# Patient Record
Sex: Male | Born: 1985 | Race: Black or African American | Hispanic: No | Marital: Married | Smoking: Never smoker
Health system: Southern US, Community
[De-identification: ages and names within clinical notes are randomized; demographics above are authoritative.]

## PROBLEM LIST (undated history)

## (undated) DIAGNOSIS — J45909 Unspecified asthma, uncomplicated: Secondary | ICD-10-CM

---

## 2013-02-12 ENCOUNTER — Encounter (HOSPITAL_COMMUNITY): Payer: Self-pay | Admitting: Emergency Medicine

## 2013-02-12 ENCOUNTER — Emergency Department (HOSPITAL_COMMUNITY)
Admission: EM | Admit: 2013-02-12 | Discharge: 2013-02-12 | Disposition: A | Discharge status: Home / Self Care | Payer: No Typology Code available for payment source | Attending: Emergency Medicine | Admitting: Emergency Medicine

## 2013-02-12 DIAGNOSIS — S39012A Strain of muscle, fascia and tendon of lower back, initial encounter: Secondary | ICD-10-CM

## 2013-02-12 DIAGNOSIS — S335XXA Sprain of ligaments of lumbar spine, initial encounter: Secondary | ICD-10-CM | POA: Insufficient documentation

## 2013-02-12 DIAGNOSIS — Y92410 Unspecified street and highway as the place of occurrence of the external cause: Secondary | ICD-10-CM | POA: Insufficient documentation

## 2013-02-12 DIAGNOSIS — Y9389 Activity, other specified: Secondary | ICD-10-CM | POA: Insufficient documentation

## 2013-02-12 DIAGNOSIS — Y9289 Other specified places as the place of occurrence of the external cause: Secondary | ICD-10-CM | POA: Insufficient documentation

## 2013-02-12 DIAGNOSIS — S139XXA Sprain of joints and ligaments of unspecified parts of neck, initial encounter: Secondary | ICD-10-CM | POA: Insufficient documentation

## 2013-02-12 DIAGNOSIS — S161XXA Strain of muscle, fascia and tendon at neck level, initial encounter: Secondary | ICD-10-CM

## 2013-02-12 MED ORDER — IBUPROFEN 400 MG PO TABS
800.0000 mg | ORAL_TABLET | Freq: Once | ORAL | Status: AC
  Administered 2013-02-12: 800 mg via ORAL
  Filled 2013-02-12: qty 2

## 2013-02-12 MED ORDER — IBUPROFEN 800 MG PO TABS: 800.0000 mg | ORAL_TABLET | Freq: Four times a day (QID) | ORAL | Status: DC | PRN
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
## 2013-02-12 NOTE — ED Notes (Signed)
Pt c/o neck and back pain, denies other sx or injuries, LS CTA, MAEx4. pt seen by EDNP prior to RN assessment, see NP notes, orders received and initiated, pt medicated and d/c'd. Given Rx x1. Steady gait.

## 2013-02-13 NOTE — ED Provider Notes (Signed)
Medical screening examination/treatment/procedure(s) were performed by non-physician practitioner and as supervising physician I was immediately available for consultation/collaboration.    Arnice Vanepps, MD 02/13/13 0230 

## 2013-10-20 ENCOUNTER — Emergency Department (HOSPITAL_COMMUNITY)
Admission: EM | Admit: 2013-10-20 | Discharge: 2013-10-20 | Disposition: A | Discharge status: Home / Self Care | Payer: No Typology Code available for payment source | Attending: Emergency Medicine | Admitting: Emergency Medicine

## 2013-10-20 ENCOUNTER — Encounter (HOSPITAL_COMMUNITY): Payer: Self-pay | Admitting: Emergency Medicine

## 2013-10-20 ENCOUNTER — Emergency Department (HOSPITAL_COMMUNITY): Payer: No Typology Code available for payment source

## 2013-10-20 DIAGNOSIS — J45909 Unspecified asthma, uncomplicated: Secondary | ICD-10-CM | POA: Diagnosis not present

## 2013-10-20 DIAGNOSIS — Y9389 Activity, other specified: Secondary | ICD-10-CM | POA: Diagnosis not present

## 2013-10-20 DIAGNOSIS — S0990XA Unspecified injury of head, initial encounter: Secondary | ICD-10-CM | POA: Diagnosis present

## 2013-10-20 DIAGNOSIS — S335XXA Sprain of ligaments of lumbar spine, initial encounter: Secondary | ICD-10-CM | POA: Insufficient documentation

## 2013-10-20 DIAGNOSIS — Y92410 Unspecified street and highway as the place of occurrence of the external cause: Secondary | ICD-10-CM | POA: Insufficient documentation

## 2013-10-20 DIAGNOSIS — S39012A Strain of muscle, fascia and tendon of lower back, initial encounter: Secondary | ICD-10-CM

## 2013-10-20 DIAGNOSIS — S161XXA Strain of muscle, fascia and tendon at neck level, initial encounter: Secondary | ICD-10-CM

## 2013-10-20 DIAGNOSIS — S139XXA Sprain of joints and ligaments of unspecified parts of neck, initial encounter: Secondary | ICD-10-CM | POA: Insufficient documentation

## 2013-10-20 HISTORY — DX: Unspecified asthma, uncomplicated: J45.909

## 2013-10-20 MED ORDER — NAPROXEN 500 MG PO TABS: 500.0000 mg | ORAL_TABLET | Freq: Two times a day (BID) | ORAL | Status: AC

## 2013-10-20 MED ORDER — CYCLOBENZAPRINE HCL 5 MG PO TABS: 5.0000 mg | ORAL_TABLET | Freq: Three times a day (TID) | ORAL | Status: DC | PRN

## 2013-10-20 MED ORDER — NAPROXEN 500 MG PO TABS: 500.0000 mg | ORAL_TABLET | Freq: Two times a day (BID) | ORAL | Status: DC

## 2013-10-20 MED ORDER — HYDROCODONE-ACETAMINOPHEN 5-325 MG PO TABS
1.0000 | ORAL_TABLET | Freq: Once | ORAL | Status: AC
  Administered 2013-10-20: 1 via ORAL
  Filled 2013-10-20: qty 1

## 2013-10-20 MED ORDER — CYCLOBENZAPRINE HCL 5 MG PO TABS: 5.0000 mg | ORAL_TABLET | Freq: Three times a day (TID) | ORAL | Status: AC | PRN

## 2013-10-20 NOTE — Discharge Instructions (Signed)
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
## 2013-10-20 NOTE — ED Notes (Signed)
Bed: WA06 Expected date:  Expected time:  Means of arrival:  Comments: ems 
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
## 2013-10-20 NOTE — ED Notes (Signed)
Patient is alert and oriented x3.  He was given DC instructions and follow up visit instructions.  Patient gave verbal understanding.  He was DC ambulatory under his own power to home.  V/S stable.  He was not showing any signs of distress on DC 

## 2013-10-20 NOTE — ED Notes (Signed)
Neck brace removed from pt. Per MD orders.
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
